# Patient Record
Sex: Female | Born: 1955 | Race: White | Hispanic: No | Marital: Married | Smoking: Never smoker
Health system: Southern US, Community
[De-identification: ages and names within clinical notes are randomized; demographics above are authoritative.]

## PROBLEM LIST (undated history)

## (undated) DIAGNOSIS — R400 Somnolence: Secondary | ICD-10-CM

## (undated) DIAGNOSIS — E039 Hypothyroidism, unspecified: Secondary | ICD-10-CM

## (undated) DIAGNOSIS — D649 Anemia, unspecified: Secondary | ICD-10-CM

## (undated) DIAGNOSIS — I10 Essential (primary) hypertension: Secondary | ICD-10-CM

## (undated) DIAGNOSIS — M1990 Unspecified osteoarthritis, unspecified site: Secondary | ICD-10-CM

## (undated) DIAGNOSIS — J309 Allergic rhinitis, unspecified: Secondary | ICD-10-CM

## (undated) DIAGNOSIS — M2550 Pain in unspecified joint: Secondary | ICD-10-CM

## (undated) HISTORY — DX: Somnolence: R40.0

## (undated) HISTORY — DX: Pain in unspecified joint: M25.50

## (undated) HISTORY — DX: Anemia, unspecified: D64.9

## (undated) HISTORY — DX: Allergic rhinitis, unspecified: J30.9

## (undated) HISTORY — DX: Hypothyroidism, unspecified: E03.9

## (undated) HISTORY — DX: Unspecified osteoarthritis, unspecified site: M19.90

---

## 2004-02-10 ENCOUNTER — Other Ambulatory Visit: Admission: RE | Admit: 2004-02-10 | Discharge: 2004-02-10 | Payer: Self-pay | Admitting: Family Medicine

## 2004-02-23 ENCOUNTER — Encounter: Admission: RE | Admit: 2004-02-23 | Discharge: 2004-02-23 | Payer: Self-pay | Admitting: Family Medicine

## 2004-02-29 ENCOUNTER — Encounter: Admission: RE | Admit: 2004-02-29 | Discharge: 2004-02-29 | Payer: Self-pay | Admitting: Family Medicine

## 2006-03-29 ENCOUNTER — Encounter: Admission: RE | Admit: 2006-03-29 | Discharge: 2006-03-29 | Payer: Self-pay | Admitting: Family Medicine

## 2006-11-20 ENCOUNTER — Other Ambulatory Visit: Admission: RE | Admit: 2006-11-20 | Discharge: 2006-11-20 | Payer: Self-pay | Admitting: Family Medicine

## 2007-04-09 ENCOUNTER — Encounter: Admission: RE | Admit: 2007-04-09 | Discharge: 2007-04-09 | Payer: Self-pay | Admitting: Family Medicine

## 2007-04-12 ENCOUNTER — Encounter: Admission: RE | Admit: 2007-04-12 | Discharge: 2007-04-12 | Payer: Self-pay | Admitting: Family Medicine

## 2008-02-21 ENCOUNTER — Other Ambulatory Visit: Admission: RE | Admit: 2008-02-21 | Discharge: 2008-02-21 | Payer: Self-pay | Admitting: Family Medicine

## 2008-02-27 ENCOUNTER — Encounter: Admission: RE | Admit: 2008-02-27 | Discharge: 2008-02-27 | Payer: Self-pay | Admitting: Family Medicine

## 2008-04-13 ENCOUNTER — Encounter: Admission: RE | Admit: 2008-04-13 | Discharge: 2008-04-13 | Payer: Self-pay | Admitting: Family Medicine

## 2009-04-14 ENCOUNTER — Encounter: Admission: RE | Admit: 2009-04-14 | Discharge: 2009-04-14 | Payer: Self-pay | Admitting: Family Medicine

## 2010-03-17 ENCOUNTER — Other Ambulatory Visit: Admission: RE | Admit: 2010-03-17 | Discharge: 2010-03-17 | Payer: Self-pay | Admitting: Family Medicine

## 2010-04-15 ENCOUNTER — Encounter: Admission: RE | Admit: 2010-04-15 | Discharge: 2010-04-15 | Payer: Self-pay | Admitting: Family Medicine

## 2011-04-03 ENCOUNTER — Other Ambulatory Visit: Payer: Self-pay | Admitting: Family Medicine

## 2011-04-03 DIAGNOSIS — Z1231 Encounter for screening mammogram for malignant neoplasm of breast: Secondary | ICD-10-CM

## 2011-04-17 ENCOUNTER — Ambulatory Visit
Admission: RE | Admit: 2011-04-17 | Discharge: 2011-04-17 | Disposition: A | Discharge status: Home / Self Care | Payer: BC Managed Care – PPO | Source: Ambulatory Visit | Attending: Family Medicine | Admitting: Family Medicine

## 2011-04-17 DIAGNOSIS — Z1231 Encounter for screening mammogram for malignant neoplasm of breast: Secondary | ICD-10-CM

## 2012-04-18 ENCOUNTER — Other Ambulatory Visit: Payer: Self-pay | Admitting: Family Medicine

## 2012-04-18 DIAGNOSIS — Z1231 Encounter for screening mammogram for malignant neoplasm of breast: Secondary | ICD-10-CM

## 2012-05-16 ENCOUNTER — Ambulatory Visit: Payer: BC Managed Care – PPO

## 2012-05-16 ENCOUNTER — Ambulatory Visit
Admission: RE | Admit: 2012-05-16 | Discharge: 2012-05-16 | Disposition: A | Discharge status: Home / Self Care | Payer: BC Managed Care – PPO | Source: Ambulatory Visit | Attending: Family Medicine | Admitting: Family Medicine

## 2012-05-16 DIAGNOSIS — Z1231 Encounter for screening mammogram for malignant neoplasm of breast: Secondary | ICD-10-CM

## 2013-03-05 ENCOUNTER — Other Ambulatory Visit (HOSPITAL_COMMUNITY)
Admission: RE | Admit: 2013-03-05 | Discharge: 2013-03-05 | Disposition: A | Discharge status: Home / Self Care | Payer: BC Managed Care – PPO | Source: Ambulatory Visit | Attending: Family Medicine | Admitting: Family Medicine

## 2013-03-05 ENCOUNTER — Other Ambulatory Visit: Payer: Self-pay | Admitting: Family Medicine

## 2013-03-05 DIAGNOSIS — Z124 Encounter for screening for malignant neoplasm of cervix: Secondary | ICD-10-CM | POA: Insufficient documentation

## 2013-03-05 DIAGNOSIS — Z1151 Encounter for screening for human papillomavirus (HPV): Secondary | ICD-10-CM | POA: Insufficient documentation

## 2013-03-06 ENCOUNTER — Other Ambulatory Visit: Payer: Self-pay | Admitting: Family Medicine

## 2013-03-06 DIAGNOSIS — R0981 Nasal congestion: Secondary | ICD-10-CM

## 2013-03-07 ENCOUNTER — Ambulatory Visit
Admission: RE | Admit: 2013-03-07 | Discharge: 2013-03-07 | Disposition: A | Discharge status: Home / Self Care | Payer: BC Managed Care – PPO | Source: Ambulatory Visit | Attending: Family Medicine | Admitting: Family Medicine

## 2013-03-07 DIAGNOSIS — R0981 Nasal congestion: Secondary | ICD-10-CM

## 2013-04-15 ENCOUNTER — Other Ambulatory Visit: Payer: Self-pay

## 2013-04-15 DIAGNOSIS — Z1231 Encounter for screening mammogram for malignant neoplasm of breast: Secondary | ICD-10-CM

## 2013-05-19 ENCOUNTER — Ambulatory Visit
Admission: RE | Admit: 2013-05-19 | Discharge: 2013-05-19 | Disposition: A | Discharge status: Home / Self Care | Payer: BC Managed Care – PPO | Source: Ambulatory Visit

## 2013-05-19 DIAGNOSIS — Z1231 Encounter for screening mammogram for malignant neoplasm of breast: Secondary | ICD-10-CM

## 2014-04-16 ENCOUNTER — Other Ambulatory Visit: Payer: Self-pay

## 2014-04-16 DIAGNOSIS — Z1231 Encounter for screening mammogram for malignant neoplasm of breast: Secondary | ICD-10-CM

## 2014-05-20 ENCOUNTER — Ambulatory Visit
Admission: RE | Admit: 2014-05-20 | Discharge: 2014-05-20 | Disposition: A | Discharge status: Home / Self Care | Payer: BC Managed Care – PPO | Source: Ambulatory Visit

## 2014-05-20 ENCOUNTER — Encounter (INDEPENDENT_AMBULATORY_CARE_PROVIDER_SITE_OTHER): Payer: Self-pay

## 2014-05-20 DIAGNOSIS — Z1231 Encounter for screening mammogram for malignant neoplasm of breast: Secondary | ICD-10-CM

## 2015-06-04 ENCOUNTER — Other Ambulatory Visit: Payer: Self-pay

## 2015-06-04 DIAGNOSIS — Z1231 Encounter for screening mammogram for malignant neoplasm of breast: Secondary | ICD-10-CM

## 2015-07-15 ENCOUNTER — Ambulatory Visit: Payer: BC Managed Care – PPO

## 2015-07-16 ENCOUNTER — Ambulatory Visit
Admission: RE | Admit: 2015-07-16 | Discharge: 2015-07-16 | Disposition: A | Discharge status: Home / Self Care | Payer: BC Managed Care – PPO | Source: Ambulatory Visit

## 2015-07-16 DIAGNOSIS — Z1231 Encounter for screening mammogram for malignant neoplasm of breast: Secondary | ICD-10-CM

## 2016-06-19 ENCOUNTER — Other Ambulatory Visit: Payer: Self-pay | Admitting: Family Medicine

## 2016-06-19 DIAGNOSIS — Z1231 Encounter for screening mammogram for malignant neoplasm of breast: Secondary | ICD-10-CM

## 2016-07-24 ENCOUNTER — Ambulatory Visit
Admission: RE | Admit: 2016-07-24 | Discharge: 2016-07-24 | Disposition: A | Discharge status: Home / Self Care | Payer: BC Managed Care – PPO | Source: Ambulatory Visit | Attending: Family Medicine | Admitting: Family Medicine

## 2016-07-24 ENCOUNTER — Encounter: Payer: Self-pay | Admitting: Radiology

## 2016-07-24 DIAGNOSIS — Z1231 Encounter for screening mammogram for malignant neoplasm of breast: Secondary | ICD-10-CM

## 2017-07-04 ENCOUNTER — Other Ambulatory Visit: Payer: Self-pay | Admitting: Family Medicine

## 2017-07-04 DIAGNOSIS — Z1231 Encounter for screening mammogram for malignant neoplasm of breast: Secondary | ICD-10-CM

## 2017-08-01 ENCOUNTER — Ambulatory Visit
Admission: RE | Admit: 2017-08-01 | Discharge: 2017-08-01 | Disposition: A | Discharge status: Home / Self Care | Payer: BC Managed Care – PPO | Source: Ambulatory Visit | Attending: Family Medicine | Admitting: Family Medicine

## 2017-08-01 DIAGNOSIS — Z1231 Encounter for screening mammogram for malignant neoplasm of breast: Secondary | ICD-10-CM

## 2018-04-08 ENCOUNTER — Other Ambulatory Visit (HOSPITAL_COMMUNITY)
Admission: RE | Admit: 2018-04-08 | Discharge: 2018-04-08 | Disposition: A | Discharge status: Home / Self Care | Payer: BC Managed Care – PPO | Source: Ambulatory Visit | Attending: Family Medicine | Admitting: Family Medicine

## 2018-04-08 ENCOUNTER — Other Ambulatory Visit: Payer: Self-pay | Admitting: Family Medicine

## 2018-04-08 DIAGNOSIS — Z124 Encounter for screening for malignant neoplasm of cervix: Secondary | ICD-10-CM | POA: Diagnosis present

## 2018-04-09 LAB — CYTOLOGY - PAP
Diagnosis: NEGATIVE
HPV: NOT DETECTED

## 2018-06-24 ENCOUNTER — Other Ambulatory Visit: Payer: Self-pay | Admitting: Family Medicine

## 2018-06-24 DIAGNOSIS — Z1231 Encounter for screening mammogram for malignant neoplasm of breast: Secondary | ICD-10-CM

## 2018-08-05 ENCOUNTER — Ambulatory Visit
Admission: RE | Admit: 2018-08-05 | Discharge: 2018-08-05 | Disposition: A | Discharge status: Home / Self Care | Payer: BC Managed Care – PPO | Source: Ambulatory Visit | Attending: Family Medicine | Admitting: Family Medicine

## 2018-08-05 DIAGNOSIS — Z1231 Encounter for screening mammogram for malignant neoplasm of breast: Secondary | ICD-10-CM

## 2018-08-12 ENCOUNTER — Other Ambulatory Visit: Payer: Self-pay | Admitting: Family Medicine

## 2018-08-12 DIAGNOSIS — G4452 New daily persistent headache (NDPH): Secondary | ICD-10-CM

## 2018-08-23 ENCOUNTER — Ambulatory Visit
Admission: RE | Admit: 2018-08-23 | Discharge: 2018-08-23 | Disposition: A | Discharge status: Home / Self Care | Payer: BC Managed Care – PPO | Source: Ambulatory Visit | Attending: Family Medicine | Admitting: Family Medicine

## 2018-08-23 DIAGNOSIS — G4452 New daily persistent headache (NDPH): Secondary | ICD-10-CM

## 2018-08-23 MED ORDER — GADOBENATE DIMEGLUMINE 529 MG/ML IV SOLN
18.0000 mL | Freq: Once | INTRAVENOUS | Status: AC | PRN
  Administered 2018-08-23: 18 mL via INTRAVENOUS

## 2018-09-02 ENCOUNTER — Other Ambulatory Visit: Payer: Self-pay | Admitting: Family Medicine

## 2018-09-03 ENCOUNTER — Other Ambulatory Visit: Payer: Self-pay | Admitting: Family Medicine

## 2018-09-03 DIAGNOSIS — R930 Abnormal findings on diagnostic imaging of skull and head, not elsewhere classified: Secondary | ICD-10-CM

## 2018-09-05 ENCOUNTER — Encounter: Payer: Self-pay | Admitting: *Deleted

## 2018-09-05 ENCOUNTER — Other Ambulatory Visit: Payer: Self-pay | Admitting: *Deleted

## 2018-09-09 ENCOUNTER — Ambulatory Visit: Payer: BC Managed Care – PPO | Admitting: Diagnostic Neuroimaging

## 2018-09-09 ENCOUNTER — Encounter: Payer: Self-pay | Admitting: Diagnostic Neuroimaging

## 2018-09-09 VITALS — BP 154/88 | HR 89 | Ht 64.0 in | Wt 187.0 lb

## 2018-09-09 DIAGNOSIS — R519 Headache, unspecified: Secondary | ICD-10-CM

## 2018-09-09 DIAGNOSIS — R9089 Other abnormal findings on diagnostic imaging of central nervous system: Secondary | ICD-10-CM | POA: Diagnosis not present

## 2018-09-09 DIAGNOSIS — R51 Headache: Secondary | ICD-10-CM | POA: Diagnosis not present

## 2018-09-09 NOTE — Patient Instructions (Signed)
HEADACHES (likely related to hypertension; may represent migraine phenomenon) - continue BP treatment and monitoring - check ESR, CRP (temporal arteritis evaluation)  DURAL ENHANCEMENT ON MRI (likely incidental finding) - recommend lumbar puncture to measure opening pressure and rule out infx, inflamm, malignancy

## 2018-09-09 NOTE — Progress Notes (Signed)
[facility]  PATIENT: Jane Johnson DOB: 08/05/1955  REFERRING CLINICIAN: Garlon Hatchet HISTORY FROM: patient  REASON FOR VISIT: new consult   HISTORICAL  CHIEF COMPLAINT:  Chief Complaint  Patient presents with  . Abnormal MRI    rm 6, New Pt, husband- Rush Landmark, "was having unrelenting headaches in Dec, better since BP has come down"     HISTORY OF PRESENT ILLNESS:   63 year old female here for evaluation of headaches and abnormal MRI brain.  December 2019 patient was having headaches for about 6 days.  She describes a right frontal severe headache.  Patient went to PCP and had MRI of the brain which showed diffuse dural enhancement.  Based on this she was also scheduled to have lumbar puncture to evaluate for intracranial hypotension or other causes of dural enhancement.  Patient declined to go to lumbar puncture until neurology consultation.  No recent  Patient having some intermittent headaches ongoing.  Standing ups tends to reduce her headache symptoms, but laying down does not change it. She has been struggling with increased blood pressure for the past few months.  She had reading of systolic blood pressure 417 in January 2020.  Patient has history of "migraine" headaches since age 26 years old when she was started on oral contraceptives.  She describes throbbing severe headaches.  No nausea or vomiting.  No sensitive to light or sound.  No visual aura symptoms.   REVIEW OF SYSTEMS: Full 14 system review of systems performed and negative with exception of: Allergies blood and urine snoring hearing loss feeling hot headaches.   ALLERGIES: Allergies  Allergen Reactions  . Penicillins Hives  . Sulfa Antibiotics Rash    HOME MEDICATIONS: Outpatient Medications Prior to Visit  Medication Sig Dispense Refill  . esomeprazole (NEXIUM) 20 MG packet Take 20 mg by mouth daily before breakfast.    . fluticasone (FLONASE) 50 MCG/ACT nasal spray Place into both nostrils  daily.    Marland Kitchen levothyroxine (SYNTHROID, LEVOTHROID) 75 MCG tablet TAKE 1 TABLET BY MOUTH ONCE DAILY IN THE MORNING ON AN EMPTY STOMACH    . loratadine (CLARITIN) 10 MG tablet Take 10 mg by mouth daily.    Marland Kitchen losartan (COZAAR) 50 MG tablet TAKE 1 TABLET BY MOUTH ONCE DAILY FOR 90 DAYS    . PAZEO 0.7 % SOLN INSTILL 1 DROP INTO EACH EYE ONCE DAILY IN THE MORNING    . Polyethyl Glycol-Propyl Glycol (SYSTANE FREE OP) Apply to eye. As needed    . RESTASIS 0.05 % ophthalmic emulsion INSTILL 1 DROP TWICE DAILY INTO EACH EYE    . UNABLE TO FIND Med Name: Tumeric 1 tab daily     No facility-administered medications prior to visit.     PAST MEDICAL HISTORY: Past Medical History:  Diagnosis Date  . Allergic rhinitis   . Anemia   . Arthralgia   . Daytime somnolence   . DJD (degenerative joint disease)   . Hypothyroidism (acquired)     PAST SURGICAL HISTORY: Past Surgical History:  Procedure Laterality Date  . CESAREAN SECTION     x 3    FAMILY HISTORY: Family History  Problem Relation Age of Onset  . Stroke Mother   . CVA Mother   . Diabetes Mother   . Heart failure Mother   . Prostate cancer Father   . Thyroid disease Sister        hypothyroid  . Breast cancer Maternal Grandmother   . Heart attack Maternal Grandfather   .  Colon cancer Maternal Grandfather   . Stroke Paternal Grandmother   . CVA Paternal Grandmother   . Thyroid disease Sister     SOCIAL HISTORY: Social History   Socioeconomic History  . Marital status: Married    Spouse name: Rush Landmark  . Number of children: 3  . Years of education: 16  . Highest education level: Bachelor's degree (e.g., BA, AB, BS)  Occupational History    Comment: retired Pharmacist, hospital  Social Needs  . Financial resource strain: Not on file  . Food insecurity:    Worry: Not on file    Inability: Not on file  . Transportation needs:    Medical: Not on file    Non-medical: Not on file  Tobacco Use  . Smoking status: Never Smoker  . Smokeless  tobacco: Never Used  . Tobacco comment: "a little in early 20s"  Substance and Sexual Activity  . Alcohol use: Not on file  . Drug use: Never  . Sexual activity: Not on file  Lifestyle  . Physical activity:    Days per week: Not on file    Minutes per session: Not on file  . Stress: Not on file  Relationships  . Social connections:    Talks on phone: Not on file    Gets together: Not on file    Attends religious service: Not on file    Active member of club or organization: Not on file    Attends meetings of clubs or organizations: Not on file    Relationship status: Not on file  . Intimate partner violence:    Fear of current or ex partner: Not on file    Emotionally abused: Not on file    Physically abused: Not on file    Forced sexual activity: Not on file  Other Topics Concern  . Not on file  Social History Narrative   Lives with spouse   Caffeine- coffee, 2 cups; 1 12 oz diet Mtn Dew daily     PHYSICAL EXAM  GENERAL EXAM/CONSTITUTIONAL: Vitals:  Vitals:   09/09/18 1011  BP: (!) 154/88  Pulse: 89  Weight: 187 lb (84.8 kg)  Height: 5' 4"  (1.626 m)     Body mass index is 32.1 kg/m. Wt Readings from Last 3 Encounters:  09/09/18 187 lb (84.8 kg)     Patient is in no distress; well developed, nourished and groomed; neck is supple  CARDIOVASCULAR:  Examination of carotid arteries is normal; no carotid bruits  Regular rate and rhythm, no murmurs  Examination of peripheral vascular system by observation and palpation is normal  EYES:  Ophthalmoscopic exam of optic discs and posterior segments is normal; no papilledema or hemorrhages  Visual Acuity Screening   Right eye Left eye Both eyes  Without correction: 20/30 20/30   With correction:        MUSCULOSKELETAL:  Gait, strength, tone, movements noted in Neurologic exam below  NEUROLOGIC: MENTAL STATUS:  No flowsheet data found.  awake, alert, oriented to person, place and time  recent and  remote memory intact  normal attention and concentration  language fluent, comprehension intact, naming intact  fund of knowledge appropriate  CRANIAL NERVE:   2nd - no papilledema on fundoscopic exam  2nd, 3rd, 4th, 6th - pupils equal and reactive to light, visual fields full to confrontation, extraocular muscles intact, no nystagmus  5th - facial sensation symmetric  7th - facial strength symmetric  8th - hearing intact  9th - palate elevates symmetrically,  uvula midline  11th - shoulder shrug symmetric  12th - tongue protrusion midline  MOTOR:   normal bulk and tone, full strength in the BUE, BLE  SENSORY:   normal and symmetric to light touch, temperature, vibration  COORDINATION:   finger-nose-finger, fine finger movements normal  REFLEXES:   deep tendon reflexes present and symmetric  GAIT/STATION:   narrow based gait     DIAGNOSTIC DATA (LABS, IMAGING, TESTING) - I reviewed patient records, labs, notes, testing and imaging myself where available.  No results found for: WBC, HGB, HCT, MCV, PLT No results found for: NA, K, CL, CO2, GLUCOSE, BUN, CREATININE, CALCIUM, PROT, ALBUMIN, AST, ALT, ALKPHOS, BILITOT, GFRNONAA, GFRAA No results found for: CHOL, HDL, LDLCALC, LDLDIRECT, TRIG, CHOLHDL No results found for: HGBA1C No results found for: VITAMINB12 No results found for: TSH   08/23/18 MRI brain [I reviewed images myself and agree with interpretation. I reviewed imaging with patient and husband as well. -[facility]]  - Diffuse mild dural enhancement surrounding both cerebral hemispheres. No nodularity or mass. Otherwise normal study of the brain. Given history of headaches, differential includes intracranial hypotension. Correlate with symptoms  of orthostatic headache or trauma. Other possibilities include acute or chronic infection, neurosarcoidosis, and metastatic disease. Lumbar puncture and CSF analysis as well as CSF pressure recommended for further  evaluation.    ASSESSMENT AND PLAN  63 y.o. year old female here with new onset of right-sided headaches since December 2019.  Also with abnormal MRI of the brain.  Dx:  1. Right-sided headache   2. MRI of brain abnormal     PLAN:  HEADACHES (likely related to hypertension; may represent migraine phenomenon) - continue BP treatment and monitoring - check ESR, CRP (temporal arteritis evaluation)  DURAL ENHANCEMENT ON MRI (likely incidental finding) - recommend LP to measure opening pressure and rule out infx, inflamm, malignancy (all less likely); also would repeat MRI in 2-3 months; patient wants to hold off on LP for now  Return in about 4 months (around 01/08/2019).    Penni Bombard, MD 8/93/7342, 87:68 AM Certified in Neurology, Neurophysiology and Neuroimaging  [facility] [address], [place], [place] [address] [phone]

## 2018-09-10 ENCOUNTER — Telehealth: Payer: Self-pay | Admitting: *Deleted

## 2018-09-10 LAB — SEDIMENTATION RATE: Sed Rate: 10 mm/h (ref 0–40)

## 2018-09-10 LAB — C-REACTIVE PROTEIN: CRP: 5 mg/L (ref 0–10)

## 2018-09-10 NOTE — Telephone Encounter (Signed)
LVM requesting call back re: lab results. If patient calls back phone staff may inform her all lab results are normal.

## 2018-09-11 ENCOUNTER — Encounter: Payer: Self-pay | Admitting: Diagnostic Neuroimaging

## 2018-09-16 NOTE — Telephone Encounter (Signed)
Spoke with patient and informed her that her labs are normal. She had called back last week and was notified by phone staff. Patient verbalized understanding, appreciation of call.

## 2018-12-18 ENCOUNTER — Telehealth: Payer: Self-pay | Admitting: *Deleted

## 2018-12-18 NOTE — Telephone Encounter (Signed)
LVM advising due to current COVID 19 pandemic, our office is severely reducing in person visits in order to minimize the risk to our patients and healthcare providers. We recommend to convert your appointment to a video visit.  Advised her Dr Marjory Lies has openings today if she would like to move appt. Left # for call back.

## 2019-01-14 ENCOUNTER — Ambulatory Visit: Payer: BC Managed Care – PPO | Admitting: Diagnostic Neuroimaging

## 2019-05-06 ENCOUNTER — Other Ambulatory Visit: Payer: Self-pay | Admitting: Family Medicine

## 2019-05-06 DIAGNOSIS — E2839 Other primary ovarian failure: Secondary | ICD-10-CM

## 2019-05-06 DIAGNOSIS — Z1231 Encounter for screening mammogram for malignant neoplasm of breast: Secondary | ICD-10-CM

## 2019-08-08 ENCOUNTER — Ambulatory Visit: Payer: Medicare PPO | Attending: Internal Medicine

## 2019-08-08 DIAGNOSIS — Z23 Encounter for immunization: Secondary | ICD-10-CM | POA: Insufficient documentation

## 2019-08-08 NOTE — Progress Notes (Signed)
   Covid-19 Vaccination Clinic  Name:  Jane Johnson    MRN: [id number] DOB: [birth year]  08/08/2019  [name] was observed post Covid-19 immunization for 15 minutes without incidence. She was provided with Vaccine Information Sheet and instruction to access the V-Safe system.   Ms. Loss was instructed to call 911 with any severe reactions post vaccine: Marland Kitchen Difficulty breathing  . Swelling of your face and throat  . A fast heartbeat  . A bad rash all over your body  . Dizziness and weakness    Immunizations Administered    Name Date Dose VIS Date Route   Pfizer COVID-19 Vaccine 08/08/2019  3:08 PM 0.3 mL 06/27/2019 Intramuscular   Manufacturer: [employer], [employer]   Lot: [id number]   NDC: [address]-5

## 2019-08-11 ENCOUNTER — Ambulatory Visit
Admission: RE | Admit: 2019-08-11 | Discharge: 2019-08-11 | Disposition: A | Discharge status: Home / Self Care | Payer: Medicare PPO | Source: Ambulatory Visit | Attending: Family Medicine | Admitting: Family Medicine

## 2019-08-11 ENCOUNTER — Ambulatory Visit
Admission: RE | Admit: 2019-08-11 | Discharge: 2019-08-11 | Disposition: A | Discharge status: Home / Self Care | Payer: BC Managed Care – PPO | Source: Ambulatory Visit | Attending: Family Medicine | Admitting: Family Medicine

## 2019-08-11 ENCOUNTER — Other Ambulatory Visit: Payer: Self-pay

## 2019-08-11 DIAGNOSIS — E2839 Other primary ovarian failure: Secondary | ICD-10-CM

## 2019-08-11 DIAGNOSIS — Z1231 Encounter for screening mammogram for malignant neoplasm of breast: Secondary | ICD-10-CM

## 2019-08-29 ENCOUNTER — Ambulatory Visit: Payer: Medicare PPO | Attending: Internal Medicine

## 2019-08-29 DIAGNOSIS — Z23 Encounter for immunization: Secondary | ICD-10-CM

## 2019-08-29 NOTE — Progress Notes (Signed)
   Covid-19 Vaccination Clinic  Name:  Jane Johnson    MRN: [id number] DOB: 11-03-1955  08/29/2019  Jane Johnson was observed post Covid-19 immunization for 15 minutes without incidence. She was provided with Vaccine Information Sheet and instruction to access the V-Safe system.   Jane Johnson was instructed to call 911 with any severe reactions post vaccine: Marland Kitchen Difficulty breathing  . Swelling of your face and throat  . A fast heartbeat  . A bad rash all over your body  . Dizziness and weakness    Immunizations Administered    Name Date Dose VIS Date Route   Pfizer COVID-19 Vaccine 08/29/2019  2:34 PM 0.3 mL 06/27/2019 Intramuscular   Manufacturer: [employer], [employer]   Lot: [id number]   NDC: [id number]

## 2019-09-29 DIAGNOSIS — G4733 Obstructive sleep apnea (adult) (pediatric): Secondary | ICD-10-CM | POA: Diagnosis not present

## 2019-10-05 DIAGNOSIS — G4733 Obstructive sleep apnea (adult) (pediatric): Secondary | ICD-10-CM | POA: Diagnosis not present

## 2019-10-16 DIAGNOSIS — R0902 Hypoxemia: Secondary | ICD-10-CM | POA: Diagnosis not present

## 2019-11-05 DIAGNOSIS — G4733 Obstructive sleep apnea (adult) (pediatric): Secondary | ICD-10-CM | POA: Diagnosis not present

## 2019-12-05 DIAGNOSIS — G4733 Obstructive sleep apnea (adult) (pediatric): Secondary | ICD-10-CM | POA: Diagnosis not present

## 2020-01-05 DIAGNOSIS — G4733 Obstructive sleep apnea (adult) (pediatric): Secondary | ICD-10-CM | POA: Diagnosis not present

## 2020-02-02 DIAGNOSIS — H10013 Acute follicular conjunctivitis, bilateral: Secondary | ICD-10-CM | POA: Diagnosis not present

## 2020-02-04 DIAGNOSIS — G4733 Obstructive sleep apnea (adult) (pediatric): Secondary | ICD-10-CM | POA: Diagnosis not present

## 2020-03-06 DIAGNOSIS — G4733 Obstructive sleep apnea (adult) (pediatric): Secondary | ICD-10-CM | POA: Diagnosis not present

## 2020-04-06 DIAGNOSIS — G4733 Obstructive sleep apnea (adult) (pediatric): Secondary | ICD-10-CM | POA: Diagnosis not present

## 2020-05-06 DIAGNOSIS — G4733 Obstructive sleep apnea (adult) (pediatric): Secondary | ICD-10-CM | POA: Diagnosis not present

## 2020-05-06 DIAGNOSIS — N952 Postmenopausal atrophic vaginitis: Secondary | ICD-10-CM | POA: Diagnosis not present

## 2020-05-06 DIAGNOSIS — R350 Frequency of micturition: Secondary | ICD-10-CM | POA: Diagnosis not present

## 2020-05-06 DIAGNOSIS — R1030 Lower abdominal pain, unspecified: Secondary | ICD-10-CM | POA: Diagnosis not present

## 2020-06-06 DIAGNOSIS — G4733 Obstructive sleep apnea (adult) (pediatric): Secondary | ICD-10-CM | POA: Diagnosis not present

## 2020-07-06 DIAGNOSIS — G4733 Obstructive sleep apnea (adult) (pediatric): Secondary | ICD-10-CM | POA: Diagnosis not present

## 2020-07-20 ENCOUNTER — Other Ambulatory Visit: Payer: Self-pay | Admitting: Family Medicine

## 2020-07-20 DIAGNOSIS — Z1231 Encounter for screening mammogram for malignant neoplasm of breast: Secondary | ICD-10-CM

## 2020-07-27 DIAGNOSIS — N952 Postmenopausal atrophic vaginitis: Secondary | ICD-10-CM | POA: Diagnosis not present

## 2020-07-27 DIAGNOSIS — E039 Hypothyroidism, unspecified: Secondary | ICD-10-CM | POA: Diagnosis not present

## 2020-07-27 DIAGNOSIS — E669 Obesity, unspecified: Secondary | ICD-10-CM | POA: Diagnosis not present

## 2020-07-27 DIAGNOSIS — Z86010 Personal history of colonic polyps: Secondary | ICD-10-CM | POA: Diagnosis not present

## 2020-07-27 DIAGNOSIS — K219 Gastro-esophageal reflux disease without esophagitis: Secondary | ICD-10-CM | POA: Diagnosis not present

## 2020-07-27 DIAGNOSIS — I10 Essential (primary) hypertension: Secondary | ICD-10-CM | POA: Diagnosis not present

## 2020-07-27 DIAGNOSIS — Z6832 Body mass index (BMI) 32.0-32.9, adult: Secondary | ICD-10-CM | POA: Diagnosis not present

## 2020-07-27 DIAGNOSIS — R319 Hematuria, unspecified: Secondary | ICD-10-CM | POA: Diagnosis not present

## 2020-07-27 DIAGNOSIS — Z0000 Encounter for general adult medical examination without abnormal findings: Secondary | ICD-10-CM | POA: Diagnosis not present

## 2020-08-06 DIAGNOSIS — G4733 Obstructive sleep apnea (adult) (pediatric): Secondary | ICD-10-CM | POA: Diagnosis not present

## 2020-08-20 DIAGNOSIS — R102 Pelvic and perineal pain: Secondary | ICD-10-CM | POA: Diagnosis not present

## 2020-08-20 DIAGNOSIS — Z6831 Body mass index (BMI) 31.0-31.9, adult: Secondary | ICD-10-CM | POA: Diagnosis not present

## 2020-08-27 ENCOUNTER — Other Ambulatory Visit: Payer: Self-pay

## 2020-08-27 ENCOUNTER — Ambulatory Visit
Admission: RE | Admit: 2020-08-27 | Discharge: 2020-08-27 | Disposition: A | Discharge status: Home / Self Care | Payer: Medicare PPO | Source: Ambulatory Visit | Attending: Family Medicine | Admitting: Family Medicine

## 2020-08-27 DIAGNOSIS — Z1231 Encounter for screening mammogram for malignant neoplasm of breast: Secondary | ICD-10-CM | POA: Diagnosis not present

## 2020-11-15 DIAGNOSIS — I10 Essential (primary) hypertension: Secondary | ICD-10-CM | POA: Diagnosis not present

## 2020-11-15 DIAGNOSIS — R319 Hematuria, unspecified: Secondary | ICD-10-CM | POA: Diagnosis not present

## 2020-11-22 DIAGNOSIS — G4733 Obstructive sleep apnea (adult) (pediatric): Secondary | ICD-10-CM | POA: Diagnosis not present

## 2020-12-09 DIAGNOSIS — G4733 Obstructive sleep apnea (adult) (pediatric): Secondary | ICD-10-CM | POA: Diagnosis not present

## 2021-01-09 DIAGNOSIS — G4733 Obstructive sleep apnea (adult) (pediatric): Secondary | ICD-10-CM | POA: Diagnosis not present

## 2021-02-08 DIAGNOSIS — G4733 Obstructive sleep apnea (adult) (pediatric): Secondary | ICD-10-CM | POA: Diagnosis not present

## 2021-02-16 DIAGNOSIS — K5730 Diverticulosis of large intestine without perforation or abscess without bleeding: Secondary | ICD-10-CM | POA: Diagnosis not present

## 2021-02-16 DIAGNOSIS — D123 Benign neoplasm of transverse colon: Secondary | ICD-10-CM | POA: Diagnosis not present

## 2021-02-16 DIAGNOSIS — Z86010 Personal history of colonic polyps: Secondary | ICD-10-CM | POA: Diagnosis not present

## 2021-02-18 DIAGNOSIS — D123 Benign neoplasm of transverse colon: Secondary | ICD-10-CM | POA: Diagnosis not present

## 2021-07-25 ENCOUNTER — Other Ambulatory Visit: Payer: Self-pay | Admitting: Family Medicine

## 2021-07-25 DIAGNOSIS — Z1231 Encounter for screening mammogram for malignant neoplasm of breast: Secondary | ICD-10-CM

## 2021-08-08 DIAGNOSIS — Z0000 Encounter for general adult medical examination without abnormal findings: Secondary | ICD-10-CM | POA: Diagnosis not present

## 2021-08-08 DIAGNOSIS — R319 Hematuria, unspecified: Secondary | ICD-10-CM | POA: Diagnosis not present

## 2021-08-08 DIAGNOSIS — E039 Hypothyroidism, unspecified: Secondary | ICD-10-CM | POA: Diagnosis not present

## 2021-08-08 DIAGNOSIS — I10 Essential (primary) hypertension: Secondary | ICD-10-CM | POA: Diagnosis not present

## 2021-08-08 DIAGNOSIS — Z1389 Encounter for screening for other disorder: Secondary | ICD-10-CM | POA: Diagnosis not present

## 2021-08-08 DIAGNOSIS — E669 Obesity, unspecified: Secondary | ICD-10-CM | POA: Diagnosis not present

## 2021-08-08 DIAGNOSIS — E7800 Pure hypercholesterolemia, unspecified: Secondary | ICD-10-CM | POA: Diagnosis not present

## 2021-08-10 ENCOUNTER — Other Ambulatory Visit: Payer: Self-pay | Admitting: Family Medicine

## 2021-08-10 DIAGNOSIS — H04123 Dry eye syndrome of bilateral lacrimal glands: Secondary | ICD-10-CM | POA: Diagnosis not present

## 2021-08-10 DIAGNOSIS — H40033 Anatomical narrow angle, bilateral: Secondary | ICD-10-CM | POA: Diagnosis not present

## 2021-08-10 DIAGNOSIS — E7800 Pure hypercholesterolemia, unspecified: Secondary | ICD-10-CM

## 2021-08-29 ENCOUNTER — Ambulatory Visit: Payer: Medicare PPO

## 2021-08-31 ENCOUNTER — Ambulatory Visit
Admission: RE | Admit: 2021-08-31 | Discharge: 2021-08-31 | Disposition: A | Discharge status: Home / Self Care | Payer: Medicare PPO | Source: Ambulatory Visit | Attending: Family Medicine | Admitting: Family Medicine

## 2021-08-31 ENCOUNTER — Other Ambulatory Visit: Payer: Self-pay

## 2021-08-31 DIAGNOSIS — Z1231 Encounter for screening mammogram for malignant neoplasm of breast: Secondary | ICD-10-CM | POA: Diagnosis not present

## 2021-09-01 ENCOUNTER — Other Ambulatory Visit: Payer: Self-pay | Admitting: Family Medicine

## 2021-09-01 DIAGNOSIS — R928 Other abnormal and inconclusive findings on diagnostic imaging of breast: Secondary | ICD-10-CM

## 2021-09-12 ENCOUNTER — Ambulatory Visit
Admission: RE | Admit: 2021-09-12 | Discharge: 2021-09-12 | Disposition: A | Discharge status: Home / Self Care | Payer: No Typology Code available for payment source | Source: Ambulatory Visit | Attending: Family Medicine | Admitting: Family Medicine

## 2021-09-12 ENCOUNTER — Other Ambulatory Visit: Payer: Medicare PPO

## 2021-09-12 DIAGNOSIS — E7800 Pure hypercholesterolemia, unspecified: Secondary | ICD-10-CM

## 2021-09-26 ENCOUNTER — Ambulatory Visit: Payer: Medicare PPO

## 2021-09-26 ENCOUNTER — Ambulatory Visit
Admission: RE | Admit: 2021-09-26 | Discharge: 2021-09-26 | Disposition: A | Discharge status: Home / Self Care | Payer: Medicare PPO | Source: Ambulatory Visit | Attending: Family Medicine | Admitting: Family Medicine

## 2021-09-26 DIAGNOSIS — R922 Inconclusive mammogram: Secondary | ICD-10-CM | POA: Diagnosis not present

## 2021-09-26 DIAGNOSIS — R928 Other abnormal and inconclusive findings on diagnostic imaging of breast: Secondary | ICD-10-CM

## 2021-11-21 DIAGNOSIS — G4733 Obstructive sleep apnea (adult) (pediatric): Secondary | ICD-10-CM | POA: Diagnosis not present

## 2022-07-18 ENCOUNTER — Other Ambulatory Visit: Payer: Self-pay | Admitting: Family Medicine

## 2022-07-18 DIAGNOSIS — Z1231 Encounter for screening mammogram for malignant neoplasm of breast: Secondary | ICD-10-CM

## 2022-08-14 DIAGNOSIS — Z6831 Body mass index (BMI) 31.0-31.9, adult: Secondary | ICD-10-CM | POA: Diagnosis not present

## 2022-08-14 DIAGNOSIS — Z1389 Encounter for screening for other disorder: Secondary | ICD-10-CM | POA: Diagnosis not present

## 2022-08-14 DIAGNOSIS — Z0000 Encounter for general adult medical examination without abnormal findings: Secondary | ICD-10-CM | POA: Diagnosis not present

## 2022-08-21 DIAGNOSIS — I10 Essential (primary) hypertension: Secondary | ICD-10-CM | POA: Diagnosis not present

## 2022-08-21 DIAGNOSIS — Z79899 Other long term (current) drug therapy: Secondary | ICD-10-CM | POA: Diagnosis not present

## 2022-08-21 DIAGNOSIS — E669 Obesity, unspecified: Secondary | ICD-10-CM | POA: Diagnosis not present

## 2022-08-21 DIAGNOSIS — K219 Gastro-esophageal reflux disease without esophagitis: Secondary | ICD-10-CM | POA: Diagnosis not present

## 2022-08-21 DIAGNOSIS — E7800 Pure hypercholesterolemia, unspecified: Secondary | ICD-10-CM | POA: Diagnosis not present

## 2022-08-21 DIAGNOSIS — J309 Allergic rhinitis, unspecified: Secondary | ICD-10-CM | POA: Diagnosis not present

## 2022-08-21 DIAGNOSIS — R319 Hematuria, unspecified: Secondary | ICD-10-CM | POA: Diagnosis not present

## 2022-08-21 DIAGNOSIS — E039 Hypothyroidism, unspecified: Secondary | ICD-10-CM | POA: Diagnosis not present

## 2022-09-06 ENCOUNTER — Ambulatory Visit
Admission: RE | Admit: 2022-09-06 | Discharge: 2022-09-06 | Disposition: A | Discharge status: Home / Self Care | Payer: Medicare PPO | Source: Ambulatory Visit | Attending: Family Medicine | Admitting: Family Medicine

## 2022-09-06 DIAGNOSIS — Z1231 Encounter for screening mammogram for malignant neoplasm of breast: Secondary | ICD-10-CM | POA: Diagnosis not present

## 2022-09-26 DIAGNOSIS — H10013 Acute follicular conjunctivitis, bilateral: Secondary | ICD-10-CM | POA: Diagnosis not present

## 2022-11-06 DIAGNOSIS — H10013 Acute follicular conjunctivitis, bilateral: Secondary | ICD-10-CM | POA: Diagnosis not present

## 2022-11-14 DIAGNOSIS — H5789 Other specified disorders of eye and adnexa: Secondary | ICD-10-CM | POA: Diagnosis not present

## 2022-11-14 DIAGNOSIS — Z6831 Body mass index (BMI) 31.0-31.9, adult: Secondary | ICD-10-CM | POA: Diagnosis not present

## 2022-11-22 DIAGNOSIS — G4733 Obstructive sleep apnea (adult) (pediatric): Secondary | ICD-10-CM | POA: Diagnosis not present

## 2022-12-14 DIAGNOSIS — G4733 Obstructive sleep apnea (adult) (pediatric): Secondary | ICD-10-CM | POA: Diagnosis not present

## 2022-12-15 DIAGNOSIS — G4733 Obstructive sleep apnea (adult) (pediatric): Secondary | ICD-10-CM | POA: Diagnosis not present

## 2023-01-30 DIAGNOSIS — H10013 Acute follicular conjunctivitis, bilateral: Secondary | ICD-10-CM | POA: Diagnosis not present

## 2023-03-16 DIAGNOSIS — G4733 Obstructive sleep apnea (adult) (pediatric): Secondary | ICD-10-CM | POA: Diagnosis not present

## 2023-03-22 DIAGNOSIS — H10013 Acute follicular conjunctivitis, bilateral: Secondary | ICD-10-CM | POA: Diagnosis not present

## 2023-07-27 ENCOUNTER — Other Ambulatory Visit: Payer: Self-pay | Admitting: Family Medicine

## 2023-07-27 DIAGNOSIS — Z1231 Encounter for screening mammogram for malignant neoplasm of breast: Secondary | ICD-10-CM

## 2023-08-16 DIAGNOSIS — Z1331 Encounter for screening for depression: Secondary | ICD-10-CM | POA: Diagnosis not present

## 2023-08-16 DIAGNOSIS — Z6831 Body mass index (BMI) 31.0-31.9, adult: Secondary | ICD-10-CM | POA: Diagnosis not present

## 2023-08-16 DIAGNOSIS — Z23 Encounter for immunization: Secondary | ICD-10-CM | POA: Diagnosis not present

## 2023-08-16 DIAGNOSIS — Z0000 Encounter for general adult medical examination without abnormal findings: Secondary | ICD-10-CM | POA: Diagnosis not present

## 2023-09-04 DIAGNOSIS — Z6831 Body mass index (BMI) 31.0-31.9, adult: Secondary | ICD-10-CM | POA: Diagnosis not present

## 2023-09-04 DIAGNOSIS — E669 Obesity, unspecified: Secondary | ICD-10-CM | POA: Diagnosis not present

## 2023-09-04 DIAGNOSIS — E7800 Pure hypercholesterolemia, unspecified: Secondary | ICD-10-CM | POA: Diagnosis not present

## 2023-09-04 DIAGNOSIS — E039 Hypothyroidism, unspecified: Secondary | ICD-10-CM | POA: Diagnosis not present

## 2023-09-04 DIAGNOSIS — K219 Gastro-esophageal reflux disease without esophagitis: Secondary | ICD-10-CM | POA: Diagnosis not present

## 2023-09-04 DIAGNOSIS — R319 Hematuria, unspecified: Secondary | ICD-10-CM | POA: Diagnosis not present

## 2023-09-04 DIAGNOSIS — R350 Frequency of micturition: Secondary | ICD-10-CM | POA: Diagnosis not present

## 2023-09-04 DIAGNOSIS — I10 Essential (primary) hypertension: Secondary | ICD-10-CM | POA: Diagnosis not present

## 2023-09-10 ENCOUNTER — Ambulatory Visit
Admission: RE | Admit: 2023-09-10 | Discharge: 2023-09-10 | Disposition: A | Discharge status: Home / Self Care | Payer: Medicare PPO | Source: Ambulatory Visit | Attending: Family Medicine | Admitting: Family Medicine

## 2023-09-10 DIAGNOSIS — Z1231 Encounter for screening mammogram for malignant neoplasm of breast: Secondary | ICD-10-CM

## 2023-09-18 DIAGNOSIS — G4733 Obstructive sleep apnea (adult) (pediatric): Secondary | ICD-10-CM | POA: Diagnosis not present

## 2023-09-26 IMAGING — MG MM DIGITAL DIAGNOSTIC UNILAT*L* W/ TOMO W/ CAD
4 series · 4 of 12 positions shown · non-contrast
Comparison: Previous exam(s).

CLINICAL DATA: Screening recall for possible left breast
asymmetries.

EXAM:
DIGITAL DIAGNOSTIC UNILATERAL LEFT MAMMOGRAM WITH TOMOSYNTHESIS AND
CAD
TECHNIQUE: Left digital diagnostic mammography and breast tomosynthesis was
performed. The images were evaluated with computer-aided detection.

[L ML synth-2D]
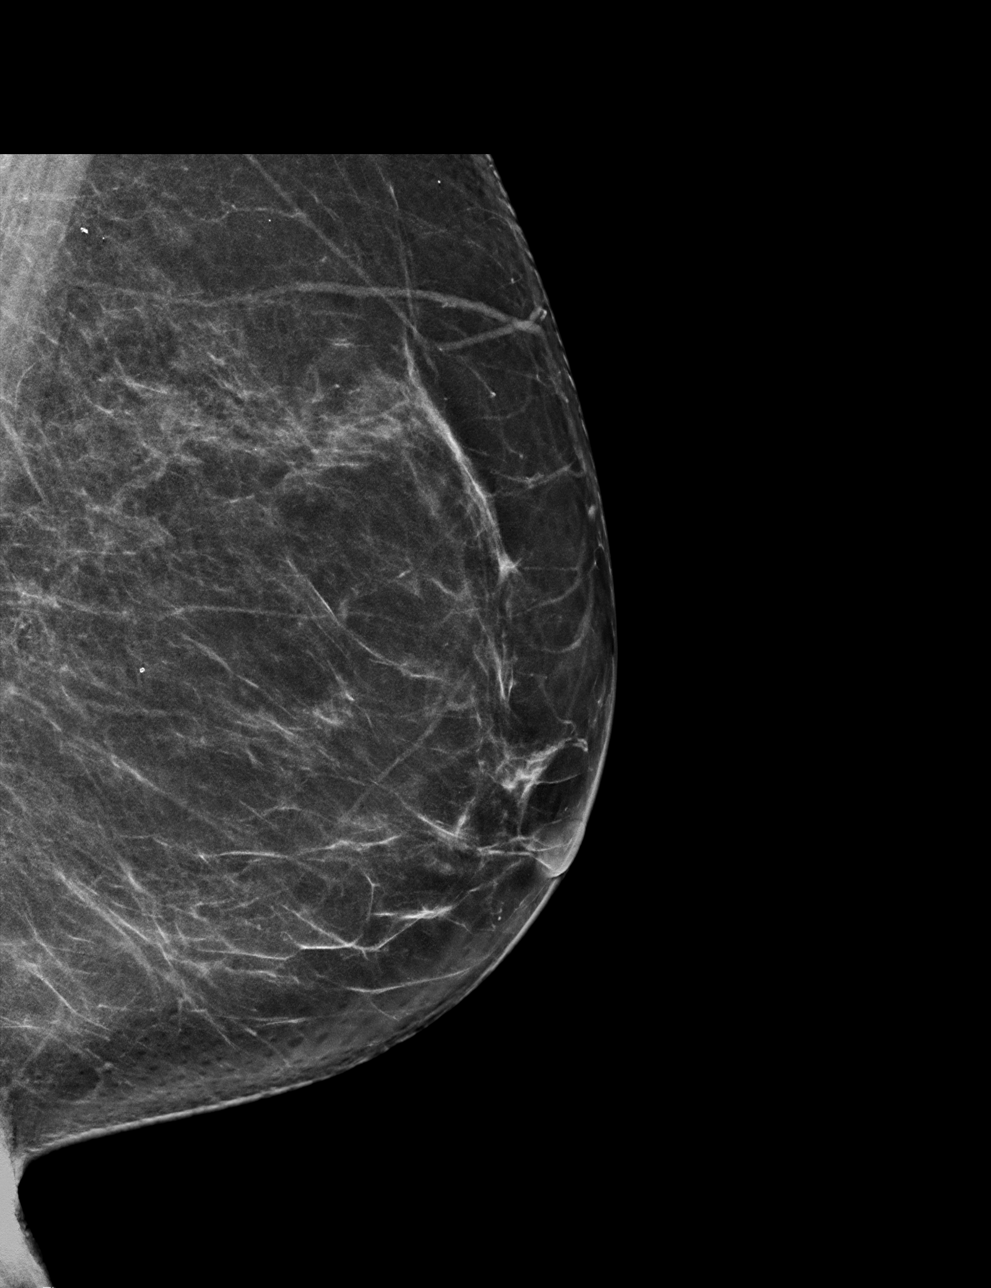

[L MLO synth-2D]
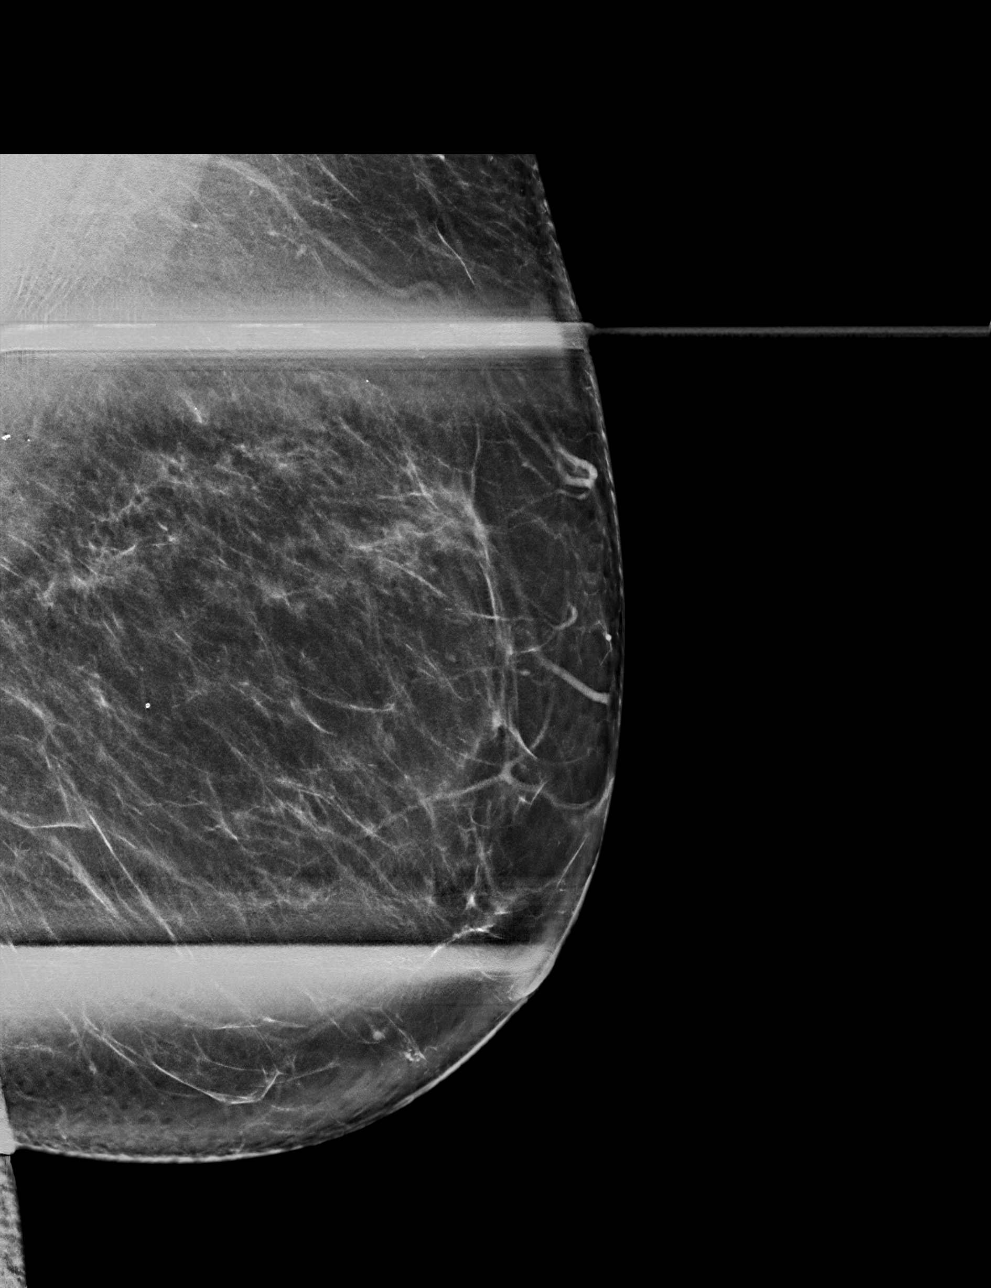

[L ML tomo · tomo slice 35/70.0]
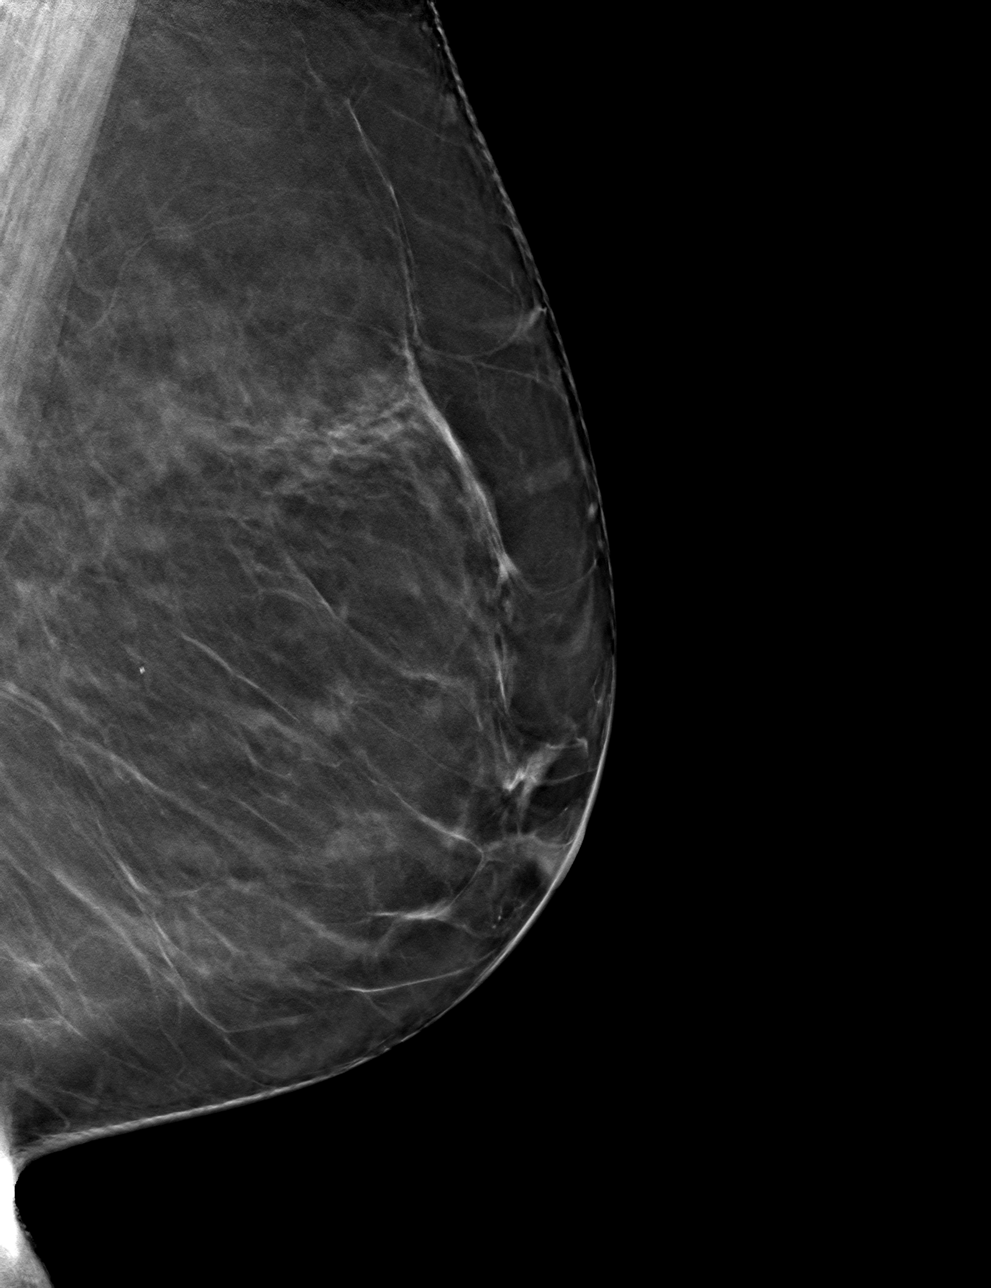

[L MLO tomo · tomo slice 40/79.0]
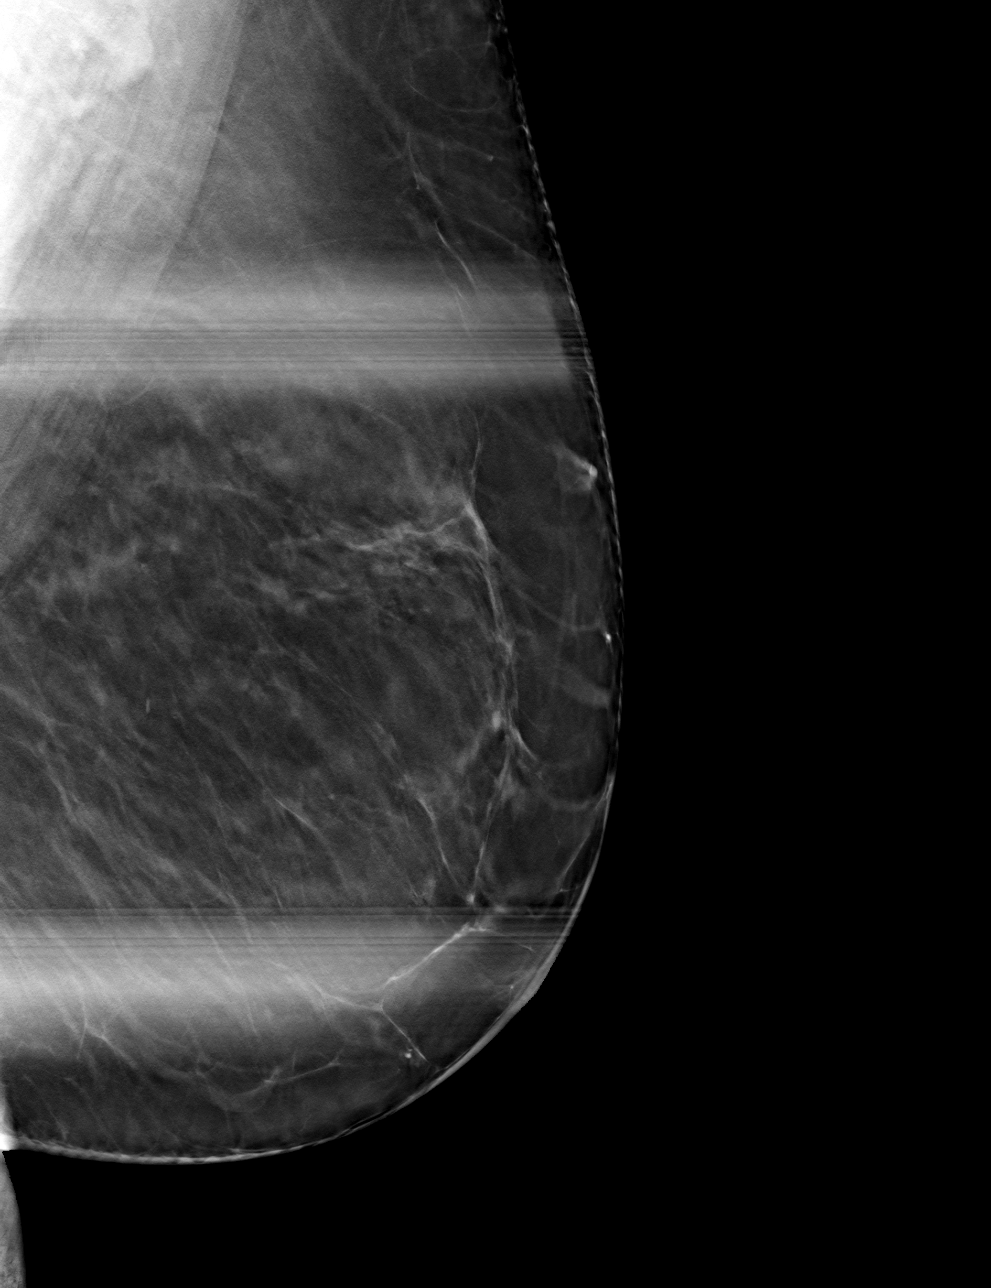

[4 of 12 positions shown; findings below may reference images not displayed]

ACR Breast Density Category b: There are scattered areas of
fibroglandular density.
FINDINGS: The possible left breast asymmetries, which were noted on the
current left breast screening MLO view, disperse on spot compression
imaging consistent with superimposed fibroglandular tissue. There is
no underlying mass or significant residual asymmetry. There are no
areas of architectural distortion and no suspicious calcifications.
IMPRESSION: No evidence of breast malignancy.

RECOMMENDATION:
Screening mammogram in one year.(Code:[id number])

I have discussed the findings and recommendations with the patient.
If applicable, a reminder letter will be sent to the patient
regarding the next appointment.

BI-RADS CATEGORY  1: Negative.

## 2023-10-15 ENCOUNTER — Emergency Department (HOSPITAL_COMMUNITY)
Admission: EM | Admit: 2023-10-15 | Discharge: 2023-10-15 | Disposition: A | Discharge status: Home / Self Care | Attending: Emergency Medicine | Admitting: Emergency Medicine

## 2023-10-15 ENCOUNTER — Other Ambulatory Visit: Payer: Self-pay

## 2023-10-15 ENCOUNTER — Encounter (HOSPITAL_COMMUNITY): Payer: Self-pay

## 2023-10-15 ENCOUNTER — Emergency Department (HOSPITAL_COMMUNITY)

## 2023-10-15 DIAGNOSIS — Z79899 Other long term (current) drug therapy: Secondary | ICD-10-CM | POA: Insufficient documentation

## 2023-10-15 DIAGNOSIS — K429 Umbilical hernia without obstruction or gangrene: Secondary | ICD-10-CM | POA: Diagnosis not present

## 2023-10-15 DIAGNOSIS — Z79890 Hormone replacement therapy (postmenopausal): Secondary | ICD-10-CM | POA: Insufficient documentation

## 2023-10-15 DIAGNOSIS — R350 Frequency of micturition: Secondary | ICD-10-CM | POA: Insufficient documentation

## 2023-10-15 DIAGNOSIS — I10 Essential (primary) hypertension: Secondary | ICD-10-CM | POA: Diagnosis not present

## 2023-10-15 DIAGNOSIS — R109 Unspecified abdominal pain: Secondary | ICD-10-CM | POA: Insufficient documentation

## 2023-10-15 DIAGNOSIS — E039 Hypothyroidism, unspecified: Secondary | ICD-10-CM | POA: Insufficient documentation

## 2023-10-15 DIAGNOSIS — K449 Diaphragmatic hernia without obstruction or gangrene: Secondary | ICD-10-CM | POA: Diagnosis not present

## 2023-10-15 DIAGNOSIS — K8020 Calculus of gallbladder without cholecystitis without obstruction: Secondary | ICD-10-CM | POA: Diagnosis not present

## 2023-10-15 DIAGNOSIS — K5730 Diverticulosis of large intestine without perforation or abscess without bleeding: Secondary | ICD-10-CM | POA: Diagnosis not present

## 2023-10-15 HISTORY — DX: Essential (primary) hypertension: I10

## 2023-10-15 LAB — BASIC METABOLIC PANEL WITH GFR
Anion gap: 12 (ref 5–15)
BUN: 23 mg/dL (ref 8–23)
CO2: 20 mmol/L — ABNORMAL LOW (ref 22–32)
Calcium: 9.3 mg/dL (ref 8.9–10.3)
Chloride: 109 mmol/L (ref 98–111)
Creatinine, Ser: 1 mg/dL (ref 0.44–1.00)
GFR, Estimated: >60 mL/min (ref >60)
Glucose, Bld: 128 mg/dL — ABNORMAL HIGH (ref 70–99)
Potassium: 4.1 mmol/L (ref 3.5–5.1)
Sodium: 141 mmol/L (ref 135–145)

## 2023-10-15 LAB — CBC
HCT: 40.9 % (ref 36.0–46.0)
Hemoglobin: 12.9 g/dL (ref 12.0–15.0)
MCH: 29.1 pg (ref 26.0–34.0)
MCHC: 31.5 g/dL (ref 30.0–36.0)
MCV: 92.3 fL (ref 80.0–100.0)
Platelets: 270 10*3/uL (ref 150–400)
RBC: 4.43 MIL/uL (ref 3.87–5.11)
RDW: 14 % (ref 11.5–15.5)
WBC: 7 10*3/uL (ref 4.0–10.5)
nRBC: 0 % (ref 0.0–0.2)

## 2023-10-15 LAB — HEPATIC FUNCTION PANEL
ALT: 18 U/L (ref 0–44)
AST: 20 U/L (ref 15–41)
Albumin: 3.8 g/dL (ref 3.5–5.0)
Alkaline Phosphatase: 47 U/L (ref 38–126)
Bilirubin, Direct: 0.1 mg/dL (ref 0.0–0.2)
Indirect Bilirubin: 0.7 mg/dL (ref 0.3–0.9)
Total Bilirubin: 0.8 mg/dL (ref 0.0–1.2)
Total Protein: 7.5 g/dL (ref 6.5–8.1)

## 2023-10-15 LAB — URINALYSIS, ROUTINE W REFLEX MICROSCOPIC
Bacteria, UA: NONE SEEN
Bilirubin Urine: NEGATIVE
Glucose, UA: NEGATIVE mg/dL
Ketones, ur: NEGATIVE mg/dL
Leukocytes,Ua: NEGATIVE
Nitrite: NEGATIVE
Protein, ur: NEGATIVE mg/dL
Specific Gravity, Urine: 1.014 (ref 1.005–1.030)
pH: 6 (ref 5.0–8.0)

## 2023-10-15 LAB — MAGNESIUM: Magnesium: 1.8 mg/dL (ref 1.7–2.4)

## 2023-10-15 MED ORDER — METHOCARBAMOL 500 MG PO TABS: 500.0000 mg | ORAL_TABLET | Freq: Three times a day (TID) | ORAL | 0 refills | Status: AC | PRN

## 2023-10-15 MED ORDER — METHOCARBAMOL 500 MG PO TABS
1000.0000 mg | ORAL_TABLET | Freq: Once | ORAL | Status: AC
Start: 2023-10-15 — End: 2023-10-15
  Administered 2023-10-15: 1000 mg via ORAL
  Filled 2023-10-15: qty 2

## 2023-10-15 MED ORDER — KETOROLAC TROMETHAMINE 15 MG/ML IJ SOLN
15.0000 mg | Freq: Once | INTRAMUSCULAR | Status: AC
  Administered 2023-10-15: 15 mg via INTRAVENOUS
  Filled 2023-10-15: qty 1

## 2023-10-15 MED ORDER — OXYCODONE-ACETAMINOPHEN 5-325 MG PO TABS: 1.0000 | ORAL_TABLET | Freq: Three times a day (TID) | ORAL | 0 refills | Status: AC | PRN

## 2023-10-15 MED ORDER — TAMSULOSIN HCL 0.4 MG PO CAPS: 0.4000 mg | ORAL_CAPSULE | Freq: Every day | ORAL | 0 refills | Status: AC

## 2023-10-15 MED ORDER — FENTANYL CITRATE PF 50 MCG/ML IJ SOSY
50.0000 ug | PREFILLED_SYRINGE | INTRAMUSCULAR | Status: DC | PRN
  Administered 2023-10-15 (×2): 50 ug via INTRAVENOUS
  Filled 2023-10-15 (×2): qty 1

## 2023-10-15 MED ORDER — LACTATED RINGERS IV BOLUS
1000.0000 mL | Freq: Once | INTRAVENOUS | Status: AC
  Administered 2023-10-15: 1000 mL via INTRAVENOUS

## 2023-10-15 MED ORDER — OXYCODONE-ACETAMINOPHEN 5-325 MG PO TABS
1.0000 | ORAL_TABLET | Freq: Once | ORAL | Status: AC
  Administered 2023-10-15: 1 via ORAL
  Filled 2023-10-15: qty 1

## 2023-10-15 MED ORDER — TAMSULOSIN HCL 0.4 MG PO CAPS
0.4000 mg | ORAL_CAPSULE | Freq: Once | ORAL | Status: AC
  Administered 2023-10-15: 0.4 mg via ORAL
  Filled 2023-10-15: qty 1

## 2023-10-15 MED ORDER — ONDANSETRON HCL 4 MG/2ML IJ SOLN
4.0000 mg | Freq: Once | INTRAMUSCULAR | Status: AC
  Administered 2023-10-15: 4 mg via INTRAVENOUS
  Filled 2023-10-15: qty 2

## 2023-10-15 NOTE — ED Provider Notes (Addendum)
 [facility] Provider Note   CSN: [id number] Arrival date & time: 10/15/23  4098     History  Chief Complaint  Patient presents with   Flank Pain    Jane Johnson is a 68 y.o. female.   Flank Pain  Patient presents for right flank pain.  Medical history includes HTN, hypothyroidism, anemia.  3 days ago, she woke up with right-sided flank pain.  She denies any recent injuries.  Over the weekend, pain diminished.  She had recurrence of severe discomfort today.  At 6 AM, she took 200 mg of ibuprofen.  She denies any associated nausea.  She has not had any dysuria or hematuria.  She does have urine frequency which she states is chronic.  She has no known history of nephrolithiasis.     Home Medications Prior to Admission medications   Medication Sig Start Date End Date Taking? Authorizing Provider  methocarbamol (ROBAXIN) 500 MG tablet Take 1 tablet (500 mg total) by mouth every 8 (eight) hours as needed for muscle spasms. 10/15/23  Yes Gloris Manchester, MD  oxyCODONE-acetaminophen (PERCOCET/ROXICET) 5-325 MG tablet Take 1 tablet by mouth every 8 (eight) hours as needed for up to 3 days for severe pain (pain score 7-10). 10/15/23 10/18/23 Yes Gloris Manchester, MD  tamsulosin (FLOMAX) 0.4 MG CAPS capsule Take 1 capsule (0.4 mg total) by mouth daily for 21 days. 10/15/23 11/05/23 Yes Gloris Manchester, MD  azelastine (OPTIVAR) 0.05 % ophthalmic solution Apply 1 drop to eye 2 (two) times daily. 08/04/23   [provider]  esomeprazole (NEXIUM) 20 MG packet Take 20 mg by mouth daily before breakfast.    [provider]  fluticasone (FLONASE) 50 MCG/ACT nasal spray Place into both nostrils daily.    [provider]  levothyroxine (SYNTHROID, LEVOTHROID) 75 MCG tablet TAKE 1 TABLET BY MOUTH ONCE DAILY IN THE MORNING ON AN EMPTY STOMACH 07/03/18   [provider]  loratadine (CLARITIN) 10 MG tablet Take 10 mg by mouth daily.    [provider]  losartan (COZAAR) 100 MG tablet Take 100 mg by mouth daily. 07/06/23   [provider]  losartan (COZAAR) 50 MG tablet TAKE 1 TABLET BY MOUTH ONCE DAILY FOR 90 DAYS 08/08/18   [provider]  PAZEO 0.7 % SOLN INSTILL 1 DROP INTO EACH EYE ONCE DAILY IN THE MORNING 07/02/18   [provider]  Polyethyl Glycol-Propyl Glycol (SYSTANE FREE OP) Apply to eye. As needed    [provider]  RESTASIS 0.05 % ophthalmic emulsion INSTILL 1 DROP TWICE DAILY INTO [facility] EYE 07/03/18   [provider]  rosuvastatin (CRESTOR) 10 MG tablet Take 10 mg by mouth at bedtime. 09/07/23   [provider]  UNABLE TO FIND Med Name: Tumeric 1 tab daily    [provider]      Allergies    Penicillins and Sulfa antibiotics    Review of Systems   Review of Systems  Genitourinary:  Positive for flank pain and frequency.  All other systems reviewed and are negative.   Physical Exam Updated Vital Signs BP (!) 154/87   Pulse 88   Temp 97.8 F (36.6 C) (Oral)   Resp 19   Ht 5\' 4"  (1.626 m)   Wt 82.6 kg   SpO2 100%   BMI 31.24 kg/m  Physical Exam Vitals and nursing note reviewed.  Constitutional:      General: She is not in acute  distress.    Appearance: Normal appearance. She is well-developed. She is not ill-appearing, toxic-appearing or diaphoretic.  HENT:     Head: Normocephalic and atraumatic.     Right Ear: External ear normal.     Left Ear: External ear normal.     Nose: Nose normal.     Mouth/Throat:     Mouth: Mucous membranes are moist.  Eyes:     Extraocular Movements: Extraocular movements intact.     Conjunctiva/sclera: Conjunctivae normal.  Cardiovascular:     Rate and Rhythm: Normal rate and regular rhythm.  Pulmonary:     Effort: Pulmonary effort is normal. No respiratory distress.  Abdominal:     General: There is no distension.     Palpations: Abdomen is soft.     Tenderness: There is no abdominal  tenderness.  Musculoskeletal:        General: No swelling.     Cervical back: Normal range of motion and neck supple.  Skin:    General: Skin is warm and dry.     Coloration: Skin is not jaundiced or pale.  Neurological:     General: No focal deficit present.     Mental Status: She is alert and oriented to person, place, and time.  Psychiatric:        Mood and Affect: Mood normal.        Behavior: Behavior normal.     ED Results / Procedures / Treatments   Labs (all labs ordered are listed, but only abnormal results are displayed) Labs Reviewed  URINALYSIS, ROUTINE W REFLEX MICROSCOPIC - Abnormal; Notable for the following components:      Result Value   Hgb urine dipstick MODERATE (*)    All other components within normal limits  BASIC METABOLIC PANEL WITH GFR - Abnormal; Notable for the following components:   CO2 20 (*)    Glucose, Bld 128 (*)    All other components within normal limits  CBC  HEPATIC FUNCTION PANEL  MAGNESIUM    EKG None  Radiology CT Renal Stone Study Result Date: 10/15/2023 CLINICAL DATA:  Abdominal/flank pain, stone suspected Right flank pain for 3 days. EXAM: CT ABDOMEN AND PELVIS WITHOUT CONTRAST TECHNIQUE: Multidetector CT imaging of the abdomen and pelvis was performed following the standard protocol without IV contrast. RADIATION DOSE REDUCTION: This exam was performed according to the departmental dose-optimization program which includes automated exposure control, adjustment of the mA and/or kV according to patient size and/or use of iterative reconstruction technique. COMPARISON:  None Available. FINDINGS: Lower chest: Mild scarring or atelectasis at the left lung base. No significant pleural or pericardial effusion. There is a small hiatal hernia. Hepatobiliary: The liver appears unremarkable as imaged in the noncontrast state. There are multiple calcified gallstones. No evidence of gallbladder wall thickening, surrounding inflammation or biliary  ductal dilatation. Pancreas: Unremarkable. No pancreatic ductal dilatation or surrounding inflammatory changes. Spleen: Normal in size without focal abnormality. Adrenals/Urinary Tract: Both adrenal glands appear normal. No evidence of urinary tract calculus, hydronephrosis or perinephric soft tissue stranding. No suspicious renal lesions on noncontrast imaging. There are left renal cysts measuring up to 3.9 cm in the interpolar region for which no specific follow-up imaging is recommended. The bladder appears unremarkable for its degree of distention. Stomach/Bowel: No enteric contrast administered. As above, small hiatal hernia. The stomach otherwise appears unremarkable for its degree of distention. No bowel distension, wall thickening or surrounding inflammation. The appendix appears normal. There are diverticular changes throughout the descending  and sigmoid colon without evidence of acute inflammation. Vascular/Lymphatic: There are no enlarged abdominal or pelvic lymph nodes. Mild aortoiliac atherosclerosis. No evidence of aneurysm. Reproductive: Probable 2.8 cm left uterine fibroid. The uterus and ovaries otherwise appear unremarkable. No suspicious adnexal findings. Other: Small periumbilical hernia containing only fat. No ascites or pneumoperitoneum. Musculoskeletal: No acute or significant osseous findings. IMPRESSION: 1. No acute findings or explanation for the patient's symptoms. No evidence of urinary tract calculus or hydronephrosis. 2. Cholelithiasis without evidence of cholecystitis or biliary ductal dilatation. 3. Distal colonic diverticulosis without evidence of acute inflammation. 4. Small hiatal hernia. 5. Probable small uterine fibroid. 6.  Aortic Atherosclerosis (ICD10-I70.0). Electronically Signed   By: Carey Bullocks M.D.   On: 10/15/2023 09:19    Procedures Procedures    Medications Ordered in ED Medications  fentaNYL (SUBLIMAZE) injection 50 mcg (50 mcg Intravenous Given 10/15/23  0833)  oxyCODONE-acetaminophen (PERCOCET/ROXICET) 5-325 MG per tablet 1 tablet (1 tablet Oral Given 10/15/23 0734)  lactated ringers bolus 1,000 mL (0 mLs Intravenous Stopped 10/15/23 0833)  tamsulosin (FLOMAX) capsule 0.4 mg (0.4 mg Oral Given 10/15/23 0852)  ketorolac (TORADOL) 15 MG/ML injection 15 mg (15 mg Intravenous Given 10/15/23 0852)  ondansetron (ZOFRAN) injection 4 mg (4 mg Intravenous Given 10/15/23 0855)    ED Course/ Medical Decision Making/ A&P                                 Medical Decision Making Amount and/or Complexity of Data Reviewed Labs: ordered. Radiology: ordered.  Risk Prescription drug management.   This patient presents to the ED for concern of right flank pain, this involves an extensive number of treatment options, and is a complaint that carries with it a high risk of complications and morbidity.  The differential diagnosis includes nephrolithiasis, occult injury, muscle spasm, pneumonia, cholecystitis   Co morbidities that complicate the patient evaluation  HTN, hypothyroidism, anemia   Additional history obtained:  Additional history obtained from N/A External records from outside source obtained and reviewed including EMR   Lab Tests:  I Ordered, and personally interpreted labs.  The pertinent results include: Normal hemoglobin, no leukocytosis, normal kidney function, normal electrolytes, normal hepatobiliary enzymes, trace hematuria without evidence of urine infection.   Imaging Studies ordered:  I ordered imaging studies including CT of abdomen and pelvis I independently visualized and interpreted imaging which showed no acute findings to explain pain.  Cholelithiasis is present without evidence of cholecystitis or bile duct dilatation.  There is presence of small uterine fibroid. I agree with the radiologist interpretation   Cardiac Monitoring: / EKG:  The patient was maintained on a cardiac monitor.  I personally viewed and  interpreted the cardiac monitored which showed an underlying rhythm of: Sinus rhythm   Problem List / ED Course / Critical interventions / Medication management  Patient presenting for right flank pain.  On arrival, she is overall well-appearing.  She is pacing the room, unable to get comfortable.  No significant tenderness is noted to right flank area.  Presentation consistent with nephrolithiasis.  Percocet and fentanyl were ordered.  Workup was initiated.  After Percocet and fentanyl, patient had ongoing pain.  Although she did take ibuprofen prior to arrival, she only took 200 mg.  Will provide dose of Toradol.  CT scan showed an incidental finding of bladder volume of approximately 500 cc.  This was shortly after she urinated.  I suspect urine  retention.  Tamsulosin was ordered.  Will plan on Foley catheter placement.  Patient declined Foley catheter placement.  She did request an In-N-Out catheter here in the ED to drain her bladder.  She states that she will follow-up with her urologist, Dr. Retta Diones.  On CT scan, no obstructive uropathy was identified.  She does have a large number of gallstones.  There is no evidence of cholecystitis.  Following dose of Toradol, patient had improved pain.  Etiology may be musculoskeletal.  There is a small chance that his related to her gallstones, however, symptoms are not worsened after eating.  Patient actually has not even eaten today.  Patient to follow-up with general surgeon for possible outpatient gallbladder removal.  Given concern of urine retention, will prescribe Flomax.  Will also provide prescriptions for multimodal pain medication at home.  Patient urinated again while in the ED.  After that, In-N-Out catheter yielded 400 cc of postvoid residual urine.  Subsequent bladder scan showed empty bladder.  Patient was discharged in stable condition. I ordered medication including IV fluids for hydration; Percocet, Toradol, fentanyl for analgesia; Flomax for  urine retention Reevaluation of the patient after these medicines showed that the patient improved I have reviewed the patients home medicines and have made adjustments as needed   Social Determinants of Health:  Has access to outpatient care        Final Clinical Impression(s) / ED Diagnoses Final diagnoses:  Right flank pain    Rx / DC Orders ED Discharge Orders          Ordered    tamsulosin (FLOMAX) 0.4 MG CAPS capsule  Daily        10/15/23 0959    methocarbamol (ROBAXIN) 500 MG tablet  Every 8 hours PRN        10/15/23 1004    oxyCODONE-acetaminophen (PERCOCET/ROXICET) 5-325 MG tablet  Every 8 hours PRN        10/15/23 1004              Gloris Manchester, MD 10/15/23 1005    Gloris Manchester, MD 10/15/23 1019

## 2023-10-15 NOTE — ED Notes (Signed)
 Patient c/o nausea RN place standing order for Zofran

## 2023-10-15 NOTE — ED Notes (Signed)
 RN completed I&O and got 400cc of clear yellow urine bladder scan completed after I&O and it read 0ml, MD aware

## 2023-10-15 NOTE — Discharge Instructions (Addendum)
 For pain, take over-the-counter ibuprofen.  You can take 600 mg of ibuprofen every 6 hours.  Prescriptions for a muscle relaxer and narcotic pain medication were sent to your pharmacy.  Take these only as needed.  A prescription was also sent for tamsulosin.  This may help you drain your bladder.  In terms of incomplete drainage of bladder, you should follow-up with urology.  In terms of the large number of gallstones identified in your gallbladder, you should follow-up with a general surgeon.  Telephone numbers are below.  Return to the emergency department for any new or worsening symptoms of concern.

## 2023-10-15 NOTE — ED Triage Notes (Signed)
 Patient woke up Friday morning with right sided flank pain. Denies falls. No radiation. Pain went away then woke up this morning at 6am with same pain. Unable to sit still. Stabbing pain. No nausea. No burning urination.

## 2023-10-17 DIAGNOSIS — M7918 Myalgia, other site: Secondary | ICD-10-CM | POA: Diagnosis not present

## 2023-10-17 DIAGNOSIS — K8020 Calculus of gallbladder without cholecystitis without obstruction: Secondary | ICD-10-CM | POA: Diagnosis not present

## 2023-11-20 DIAGNOSIS — R3914 Feeling of incomplete bladder emptying: Secondary | ICD-10-CM | POA: Diagnosis not present

## 2023-11-20 DIAGNOSIS — N952 Postmenopausal atrophic vaginitis: Secondary | ICD-10-CM | POA: Diagnosis not present

## 2023-11-20 DIAGNOSIS — N8111 Cystocele, midline: Secondary | ICD-10-CM | POA: Diagnosis not present

## 2023-11-20 DIAGNOSIS — R3121 Asymptomatic microscopic hematuria: Secondary | ICD-10-CM | POA: Diagnosis not present

## 2023-11-28 DIAGNOSIS — G4733 Obstructive sleep apnea (adult) (pediatric): Secondary | ICD-10-CM | POA: Diagnosis not present

## 2023-11-28 DIAGNOSIS — I10 Essential (primary) hypertension: Secondary | ICD-10-CM | POA: Diagnosis not present

## 2023-11-28 DIAGNOSIS — E039 Hypothyroidism, unspecified: Secondary | ICD-10-CM | POA: Diagnosis not present

## 2024-03-31 DIAGNOSIS — G4733 Obstructive sleep apnea (adult) (pediatric): Secondary | ICD-10-CM | POA: Diagnosis not present

## 2024-04-10 DIAGNOSIS — H04123 Dry eye syndrome of bilateral lacrimal glands: Secondary | ICD-10-CM | POA: Diagnosis not present

## 2024-04-10 DIAGNOSIS — H40033 Anatomical narrow angle, bilateral: Secondary | ICD-10-CM | POA: Diagnosis not present

## 2024-07-22 ENCOUNTER — Other Ambulatory Visit: Payer: Self-pay | Admitting: Family Medicine

## 2024-07-22 DIAGNOSIS — Z1231 Encounter for screening mammogram for malignant neoplasm of breast: Secondary | ICD-10-CM

## 2024-09-10 ENCOUNTER — Ambulatory Visit
